# Patient Record
Sex: Male | Born: 2005 | Race: Black or African American | Hispanic: No | Marital: Single | Smoking: Never smoker
Health system: Southern US, Community
[De-identification: ages and names within clinical notes are randomized; demographics above are authoritative.]

---
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
## 2024-10-18 ENCOUNTER — Encounter: Payer: Self-pay | Admitting: Family Medicine
